# Patient Record
Sex: Female | Born: 1995 | Race: White | Hispanic: No | Marital: Single | Smoking: Never smoker
Health system: Southern US, Community
[De-identification: ages and names within clinical notes are randomized; demographics above are authoritative.]

## PROBLEM LIST (undated history)

## (undated) DIAGNOSIS — L309 Dermatitis, unspecified: Secondary | ICD-10-CM

## (undated) DIAGNOSIS — F909 Attention-deficit hyperactivity disorder, unspecified type: Secondary | ICD-10-CM

## (undated) DIAGNOSIS — F32A Depression, unspecified: Secondary | ICD-10-CM

## (undated) DIAGNOSIS — J45909 Unspecified asthma, uncomplicated: Secondary | ICD-10-CM

## (undated) DIAGNOSIS — N390 Urinary tract infection, site not specified: Secondary | ICD-10-CM

## (undated) DIAGNOSIS — F419 Anxiety disorder, unspecified: Secondary | ICD-10-CM

## (undated) DIAGNOSIS — F329 Major depressive disorder, single episode, unspecified: Secondary | ICD-10-CM

---
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
## 2015-01-15 IMAGING — CR DG ABDOMEN 2V
3 series · 3 of 3 positions shown · non-contrast
Comparison: None.

CLINICAL DATA: One week history of diarrhea ; bloody stools last
night.

EXAM:
ABDOMEN - 2 VIEW

[w abdomen upright]
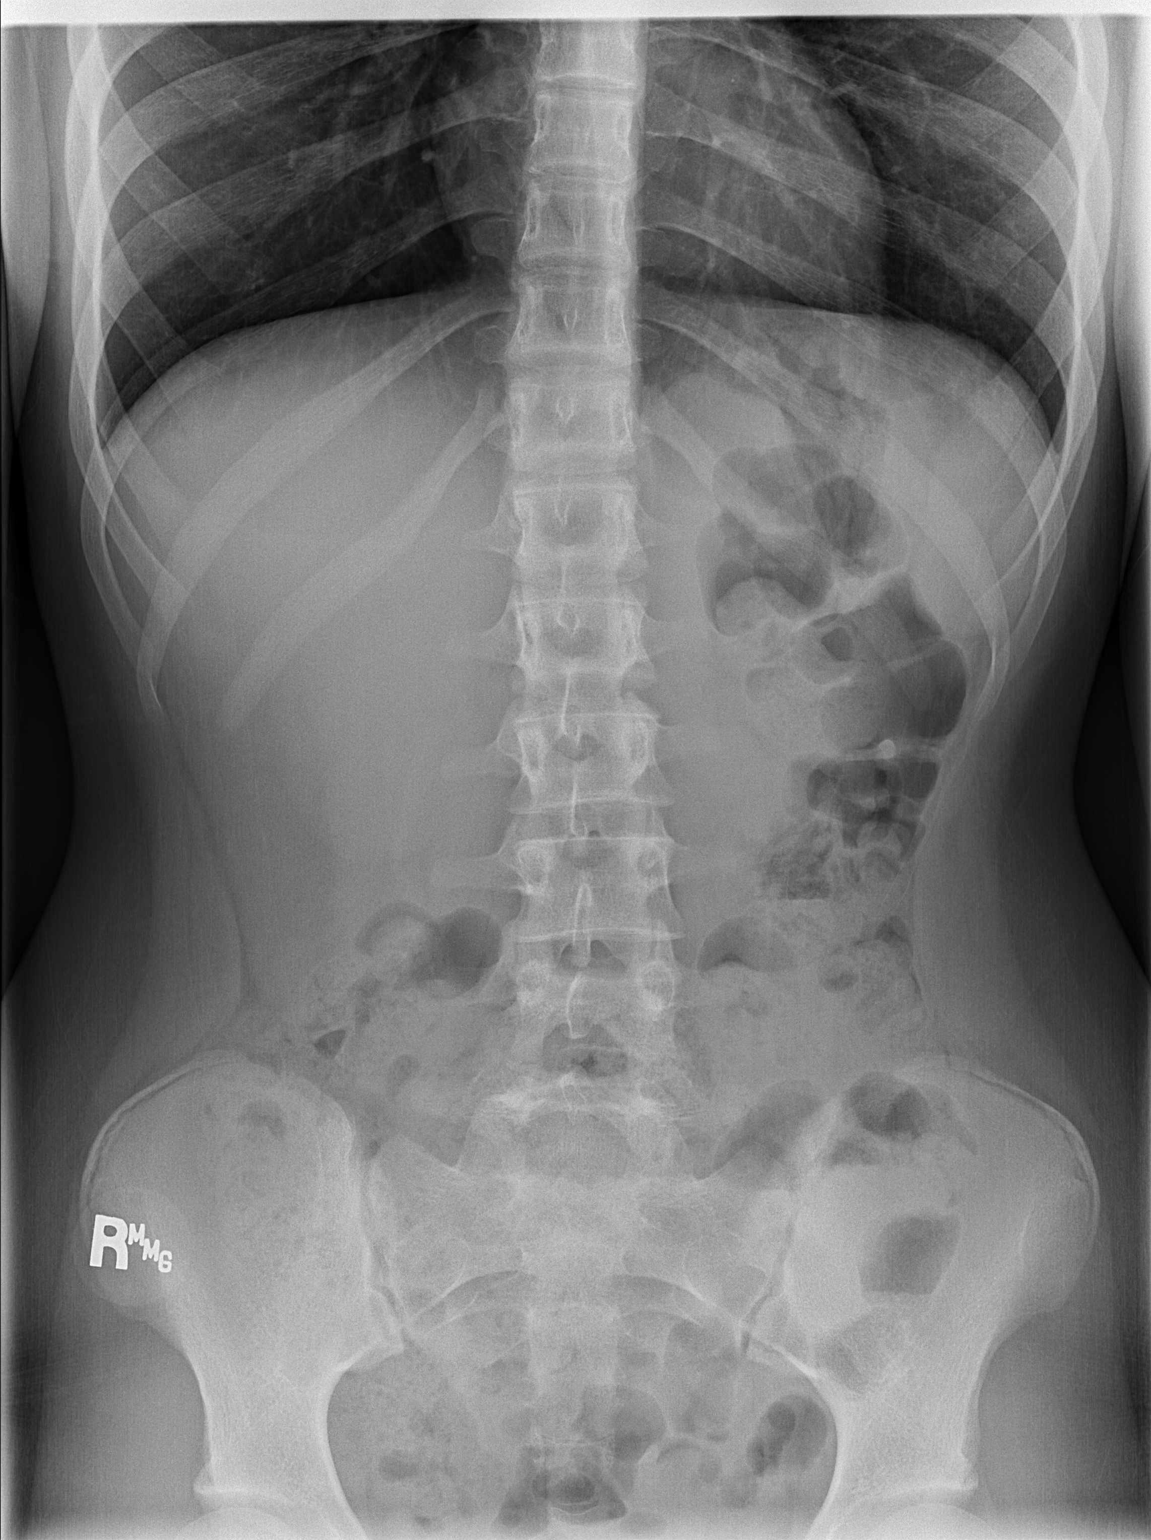

[t abdomen supine (1 of 2)]
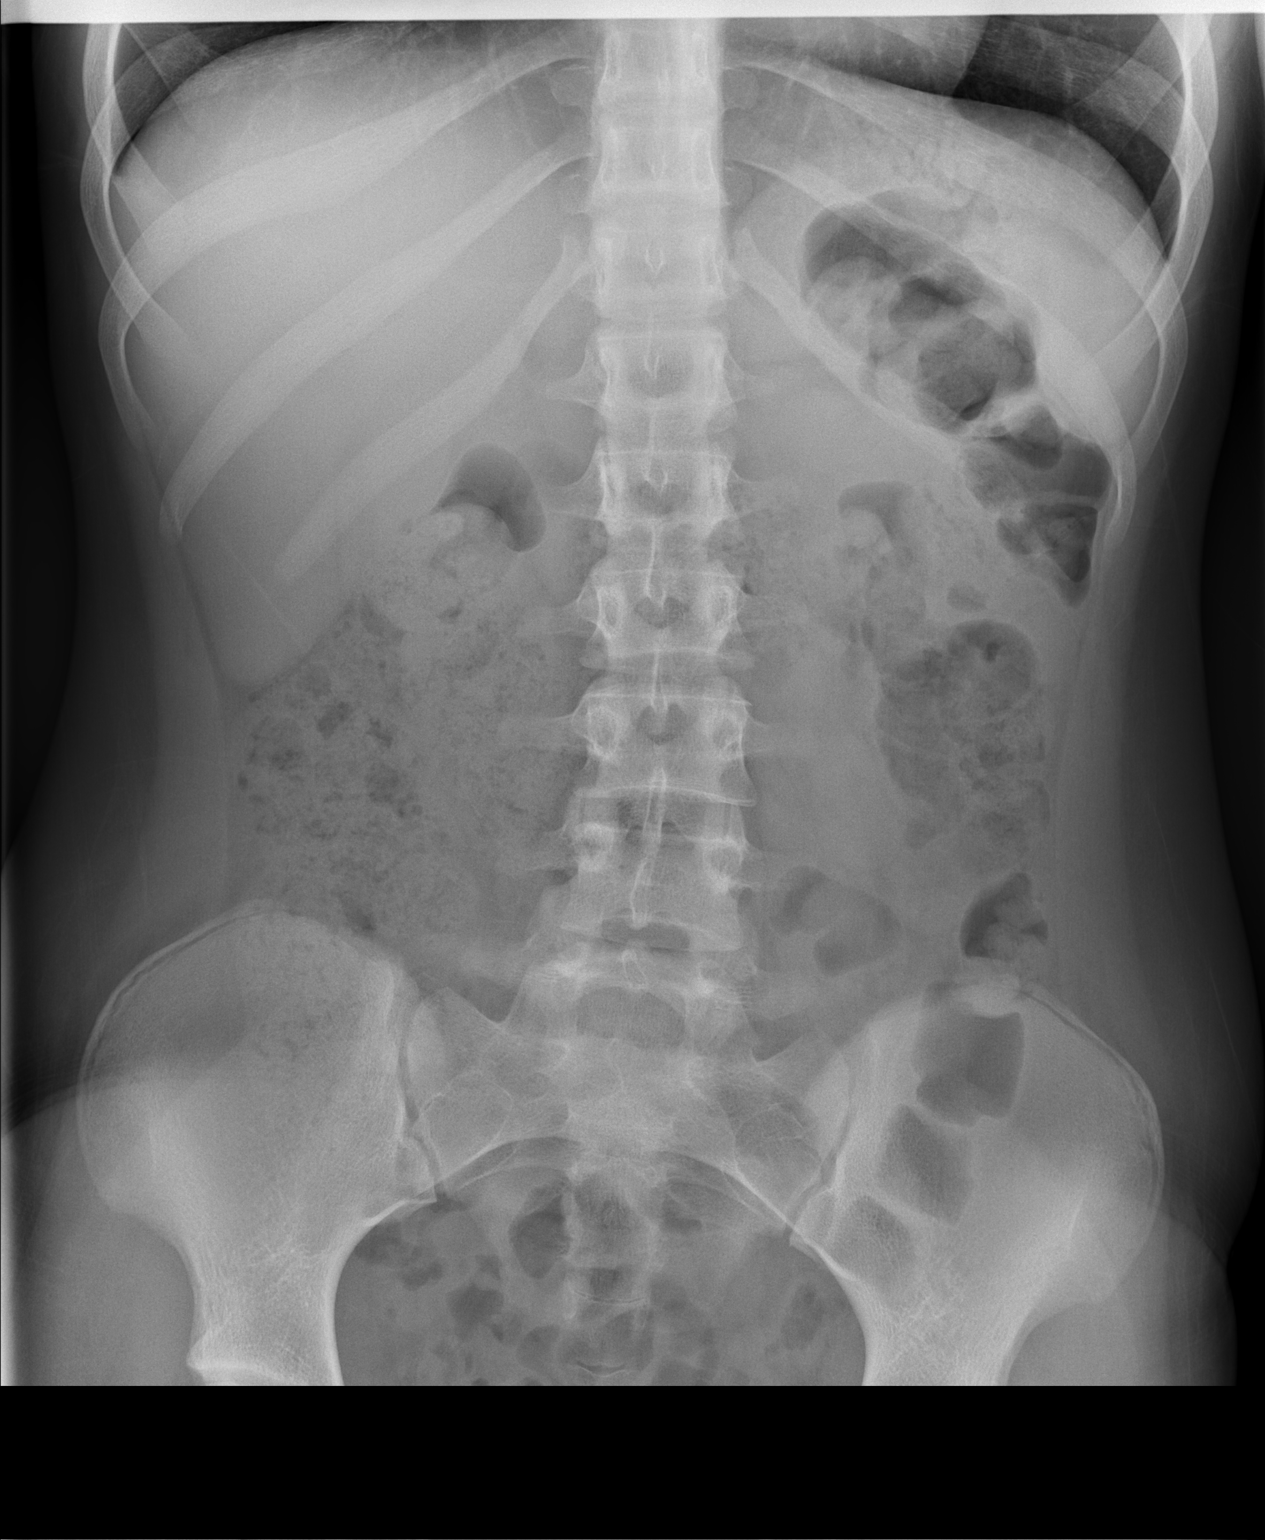

[t abdomen supine (2 of 2)]
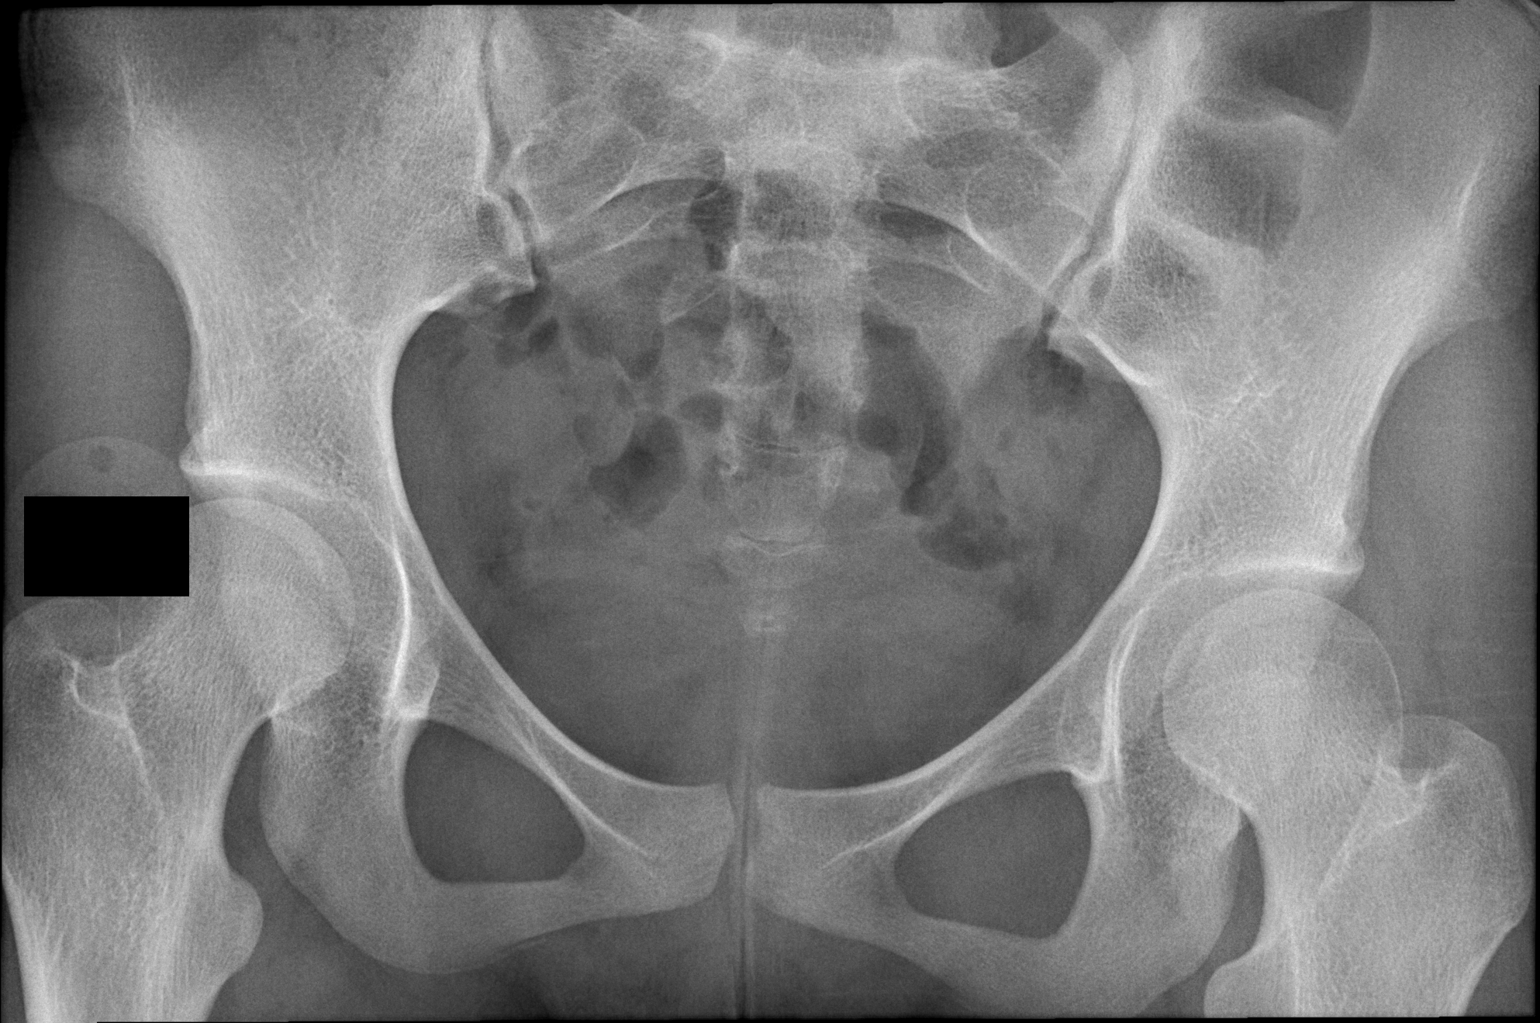

[3 of 3 positions shown; findings below may reference images not displayed]

FINDINGS: The bowel gas pattern is within the limits of normal. There is
mildly increased stool burden present especially in the right colon.
There is no evidence of a large or small bowel obstruction. No free
extraluminal gas collections are demonstrated. There are no abnormal
soft tissue calcifications. The observed bony structures appear
normal. The lung bases are clear.
IMPRESSION: There is a moderate stool burden within the colon. There is no
evidence of ileus nor obstruction.

## 2015-03-21 ENCOUNTER — Emergency Department (INDEPENDENT_AMBULATORY_CARE_PROVIDER_SITE_OTHER)
Admission: EM | Admit: 2015-03-21 | Discharge: 2015-03-21 | Disposition: A | Discharge status: Home / Self Care | Payer: PRIVATE HEALTH INSURANCE | Source: Home / Self Care | Attending: Emergency Medicine | Admitting: Emergency Medicine

## 2015-03-21 ENCOUNTER — Encounter (HOSPITAL_COMMUNITY): Payer: Self-pay | Admitting: Emergency Medicine

## 2015-03-21 DIAGNOSIS — N390 Urinary tract infection, site not specified: Secondary | ICD-10-CM | POA: Diagnosis not present

## 2015-03-21 LAB — POCT URINALYSIS DIP (DEVICE)
Bilirubin Urine: NEGATIVE
Glucose, UA: NEGATIVE mg/dL
Ketones, ur: 15 mg/dL — AB
Nitrite: POSITIVE — AB
Protein, ur: 30 mg/dL — AB
Specific Gravity, Urine: >=1.03 (ref 1.005–1.030)
UROBILINOGEN UA: 0.2 mg/dL (ref 0.0–1.0)
pH: 6 (ref 5.0–8.0)

## 2015-03-21 LAB — POCT PREGNANCY, URINE: PREG TEST UR: NEGATIVE

## 2015-03-21 MED ORDER — CEPHALEXIN 500 MG PO CAPS: 500.0000 mg | ORAL_CAPSULE | Freq: Four times a day (QID) | ORAL | Status: DC

## 2015-03-21 MED ORDER — NORETHIN-ETH ESTRAD TRIPHASIC 0.5/0.75/1-35 MG-MCG PO TABS: 1.0000 | ORAL_TABLET | Freq: Every day | ORAL | Status: AC

## 2015-03-21 NOTE — ED Provider Notes (Signed)
CSN: [id number]     Arrival date & time 03/21/15  1800 History   First MD Initiated Contact with Patient 03/21/15 1937     Chief Complaint  Patient presents with  . Recurrent UTI   (Consider location/radiation/quality/duration/timing/severity/associated sxs/prior Treatment) Patient is a 20 y.o. female presenting with frequency. The history is provided by the patient. No language interpreter was used.  Urinary Frequency This is a new problem. The problem occurs constantly. The problem has been gradually worsening. Associated symptoms include shortness of breath. Nothing aggravates the symptoms. Nothing relieves the symptoms. She has tried nothing for the symptoms. The treatment provided no relief.  Pt complains of burning with urination.   Past Medical History  Diagnosis Date  . Asthma   . Eczema   . UTI (urinary tract infection)   . ADHD (attention deficit hyperactivity disorder)   . Depressed   . Anxiety    History reviewed. No pertinent past surgical history. No family history on file. Social History  Substance Use Topics  . Smoking status: Never Smoker   . Smokeless tobacco: None  . Alcohol Use: No   OB History    No data available     Review of Systems  Respiratory: Positive for shortness of breath.   Genitourinary: Positive for frequency.  All other systems reviewed and are negative.   Allergies  Sulfur  Home Medications   Prior to Admission medications   Medication Sig Start Date End Date Taking? Authorizing Provider  albuterol (PROVENTIL HFA;VENTOLIN HFA) 108 (90 BASE) MCG/ACT inhaler Inhale 2 puffs into the lungs every 6 (six) hours as needed for wheezing or shortness of breath.    Historical Provider, MD  albuterol (PROVENTIL) (2.5 MG/3ML) 0.083% nebulizer solution Take 2.5 mg by nebulization every 6 (six) hours as needed for wheezing or shortness of breath.    Historical Provider, MD  diphenhydrAMINE (BENADRYL) 25 MG tablet Take 25 mg by mouth every 6 (six)  hours as needed for itching or allergies.    Historical Provider, MD  famotidine (PEPCID) 20 MG tablet Take 1 tablet (20 mg total) by mouth 2 (two) times daily. Patient not taking: Reported on 02/10/2014 06/11/13   Charlestine Night, PA-C  norelgestromin-ethinyl estradiol (ORTHO EVRA) 150-35 MCG/24HR transdermal patch Place 1 patch onto the skin once a week.    Historical Provider, MD  polyethylene glycol powder (GLYCOLAX/MIRALAX) powder Take 17 g by mouth daily. Patient not taking: Reported on 02/10/2014 07/20/13   Shelly Rubenstein, MD  predniSONE (DELTASONE) 50 MG tablet Take 1 tablet (50 mg total) by mouth daily. Patient not taking: Reported on 02/10/2014 06/11/13   Charlestine Night, PA-C   Meds Ordered and Administered this Visit  Medications - No data to display  BP 114/63 mmHg  Pulse 70  Temp(Src) 98.3 F (36.8 C) (Oral)  Resp 14  SpO2 100% No data found.   Physical Exam  Constitutional: She is oriented to person, place, and time. She appears well-developed and well-nourished.  HENT:  Head: Normocephalic.  Eyes: EOM are normal.  Neck: Normal range of motion.  Pulmonary/Chest: Effort normal.  Abdominal: She exhibits no distension.  Musculoskeletal: Normal range of motion.  Neurological: She is alert and oriented to person, place, and time.  Psychiatric: She has a normal mood and affect.  Nursing note and vitals reviewed.   ED Course  Procedures (including critical care time)  Labs Review Labs Reviewed  POCT URINALYSIS DIP (DEVICE) - Abnormal; Notable for the following:    Ketones, ur  15 (*)    Hgb urine dipstick TRACE (*)    Protein, ur 30 (*)    Nitrite POSITIVE (*)    Leukocytes, UA SMALL (*)    All other components within normal limits  POCT PREGNANCY, URINE    Imaging Review No results found.   Visual Acuity Review  Right Eye Distance:   Left Eye Distance:   Bilateral Distance:    Right Eye Near:   Left Eye Near:    Bilateral Near:           Pt given rx for keflex.   Pt is out of birth control.  Pt is trying to get a new provider. Pt given rx for ortotricyclic.    1. UTI (lower urinary tract infection)      Medication List    TAKE these medications        cephALEXin 500 MG capsule  Commonly known as:  KEFLEX  Take 1 capsule (500 mg total) by mouth 4 (four) times daily.     norethindrone-ethinyl estradiol 0.5/0.75/1-35 MG-MCG tablet  Commonly known as:  CYCLAFEM,ALYACEN  Take 1 tablet by mouth daily.      ASK your doctor about these medications        albuterol (2.5 MG/3ML) 0.083% nebulizer solution  Commonly known as:  PROVENTIL  Take 2.5 mg by nebulization every 6 (six) hours as needed for wheezing or shortness of breath.     albuterol 108 (90 Base) MCG/ACT inhaler  Commonly known as:  PROVENTIL HFA;VENTOLIN HFA  Inhale 2 puffs into the lungs every 6 (six) hours as needed for wheezing or shortness of breath.     diphenhydrAMINE 25 MG tablet  Commonly known as:  BENADRYL  Take 25 mg by mouth every 6 (six) hours as needed for itching or allergies.     famotidine 20 MG tablet  Commonly known as:  PEPCID  Take 1 tablet (20 mg total) by mouth 2 (two) times daily.     norelgestromin-ethinyl estradiol 150-35 MCG/24HR transdermal patch  Commonly known as:  ORTHO EVRA  Place 1 patch onto the skin once a week.     polyethylene glycol powder powder  Commonly known as:  GLYCOLAX/MIRALAX  Take 17 g by mouth daily.     predniSONE 50 MG tablet  Commonly known as:  DELTASONE  Take 1 tablet (50 mg total) by mouth daily.          Lonia SkinnerLeslie K SharonSofia, PA-C 03/21/15 2041

## 2015-03-21 NOTE — ED Notes (Signed)
History of uti.  3 day history of burning/stinging with urination.  Noted frequency.  Patient has been using AZO for several days

## 2015-03-21 NOTE — Discharge Instructions (Signed)

## 2015-05-03 ENCOUNTER — Emergency Department (INDEPENDENT_AMBULATORY_CARE_PROVIDER_SITE_OTHER)
Admission: EM | Admit: 2015-05-03 | Discharge: 2015-05-03 | Disposition: A | Discharge status: Home / Self Care | Payer: PRIVATE HEALTH INSURANCE | Source: Home / Self Care | Attending: Emergency Medicine | Admitting: Emergency Medicine

## 2015-05-03 ENCOUNTER — Other Ambulatory Visit (HOSPITAL_COMMUNITY)
Admission: RE | Admit: 2015-05-03 | Discharge: 2015-05-03 | Disposition: A | Discharge status: Home / Self Care | Payer: PRIVATE HEALTH INSURANCE | Source: Ambulatory Visit | Attending: Emergency Medicine | Admitting: Emergency Medicine

## 2015-05-03 ENCOUNTER — Encounter (HOSPITAL_COMMUNITY): Payer: Self-pay | Admitting: Emergency Medicine

## 2015-05-03 DIAGNOSIS — N390 Urinary tract infection, site not specified: Secondary | ICD-10-CM | POA: Diagnosis not present

## 2015-05-03 LAB — POCT URINALYSIS DIP (DEVICE)
Bilirubin Urine: NEGATIVE
GLUCOSE, UA: NEGATIVE mg/dL
KETONES UR: NEGATIVE mg/dL
Nitrite: NEGATIVE
PH: 7.5 (ref 5.0–8.0)
PROTEIN: 30 mg/dL — AB
SPECIFIC GRAVITY, URINE: 1.02 (ref 1.005–1.030)
UROBILINOGEN UA: 0.2 mg/dL (ref 0.0–1.0)

## 2015-05-03 LAB — POCT PREGNANCY, URINE: Preg Test, Ur: NEGATIVE

## 2015-05-03 MED ORDER — CEPHALEXIN 500 MG PO CAPS: 500.0000 mg | ORAL_CAPSULE | Freq: Two times a day (BID) | ORAL | Status: AC

## 2015-05-03 MED ORDER — PHENAZOPYRIDINE HCL 200 MG PO TABS: 200.0000 mg | ORAL_TABLET | Freq: Three times a day (TID) | ORAL | Status: AC | PRN

## 2015-05-03 NOTE — ED Notes (Signed)
Pt states she has had several UTI's in the past, and they all started with urinary frequency.  She states she has had frequency for the last week, but denies any pain, burning, or itching and has not had a fever.

## 2015-05-03 NOTE — Discharge Instructions (Signed)
Go to www.goodrx.com to look up your medications. This will give you a list of where you can find your prescriptions at the most affordable prices.  ° °This practice is taking new patients. They will see you even if you do not have insurance. ° °[facility] °[address] °[place], [place]  °[address] °[phone] ° °If you have no primary doctor, here are some resources that may be helpful: ° °- [facility]: °[address], [place], [place] [address] [place], [place] [address] °[phone] ° °Medicaid-accepting [place] Providers: °- [facility]- [address]  °([phone];  ° °- [facility]- [address], [address] °[phone] ° °- [facility]- [address], [address] °[phone] ° °- [facility]- [address] [place] [address] °[phone] ° °- [facility]- [address] °[phone]. Only accepts [place] Access Medicaid patients after they have her name applied to their card ° °-Dr. George Osei-Bonsu, [facility]. [address] [place] [address]    [place], [place] [address]  °[phone] ° °Self Pay (no insurance) in [place]: °- Sickle Cell Patients: Dr Eric Dean, [facility] [address] [phone] ° °- [facility]- [phone] ° °- Physician Referral Service- [phone] ° °- [facility]- [address], [place], [phone];  °Monday to Friday, 9 a.m. - 7 p.m.; Saturday 9 a.m. to 1 p.m. ° °- [facility] [place]- [address] [place], [place] [phone] ° °- [facility]- [address] [place] [address] [phone] °- [facility]- [address] [phone] °- [facility], [address] [place]; [phone]; or [address] [place] [address], [place]; [phone].  ° °[facility] [place], [address] [place]; [phone]; Monday to  Wednesday, 8:30 a.m. - 5 p.m.; Thursday, 8:30 a.m. - 8 p.m. ° °[facility], [address], [place]; [phone]; Monday to Friday, 8 a.m. - 4:30 p.m.  ° °[facility], [address] [place], [phone]; first and third Saturday of the month, 9:30 a.m. - 12:30 p.m. ° °[facility], [address] [place], [phone]; second Saturday of the month, 9 a.m. -noon. ° °[facility] for children. For information, call [phone]; [phone]; or [phone]. ° °Other agencies that provide inexpensive medical care: ° °   [facility]  [phone] °   [facility]  [phone] °   [facility]  [phone]  [place] [address] °   [facility]  [phone] °   [facility]  [phone], [phone]; or [phone]. ° °Chronic Pain Problems °Contact [facility]  [phone] °Patients need to be referred by their primary care doctor. ° °[place] Resources ° °[facility] [place]     [employer]                          [facility] [facility] °[address] [place]                       [address]      [address]  ° °[phone] (After Hours) ° °General Information: °Finding a doctor when you do not have health insurance   can be tricky. Although you are not limited by an insurance plan, you are of course limited by her finances and how much but he can pay out of pocket. ° °What are your options if you don't have health insurance? °  °1) Find a Doctor and Pay Out of Pocket °Although you won't have to find out who is covered by your insurance plan, it is a good idea to ask around and get recommendations. You will then need to call the office and see if the doctor you have chosen will accept you as a new patient and what types of options they offer for patients who are self-pay. Some doctors offer discounts or will set up payment  plans for their patients who do not have insurance, but you will need to ask so you aren't surprised when you get to your appointment. ° °2) Contact Your Local Health Department °Not all health departments have doctors that can see patients for sick visits, but many do, so it is worth a call to see if yours does. If you don't know where your local health department is, you can check in your phone book. The CDC also has a tool to help you locate your state's health department, and many state websites also have listings of all of their local health departments. ° °3) Find a Walk-in Clinic °If your illness is not likely to be very severe or complicated, you may want to try a walk in clinic. These are popping up all over the country in pharmacies, drugstores, and shopping centers. They're usually staffed by nurse practitioners or physician assistants that have been trained to treat common illnesses and complaints. They're usually fairly quick and inexpensive. However, if you have serious medical issues or chronic medical problems, these are probably not your best option ° °

## 2015-05-03 NOTE — ED Provider Notes (Signed)
HPI  SUBJECTIVE:  Jennifer Jefferson is a 20 y.o. female who presents with pt with frequency, cloudy, oderous urine starting 1 week ago. No aggravating or alleviating factors.  Tried azo for several dayswithout improvement.  No urgency, dysuria, hematuria. No nausea, vomiting, fever, chills, back pain. .  No  other abdominal pain, abdominal distention.  No vaginal bleeding or discharge/odor, vulvar itching, genital rash.  Patient is in a monoagmous sexual relationship with a female partner who is asxatic.   STDs are not a concern today.   Pt is not pregnant. Past medical history of UTIs, treated with Keflex patient states worked for her.  No recent antibiotic, history of recurrent UTIs, diabetes, nephrolithiasis. . No h/o STD's, BV, yeast infections.  States this feels similar to previous UTIs.    Past Medical History  Diagnosis Date  . Asthma   . Eczema   . UTI (urinary tract infection)   . ADHD (attention deficit hyperactivity disorder)   . Depressed   . Anxiety     History reviewed. No pertinent past surgical history.  History reviewed. No pertinent family history.  Social History  Substance Use Topics  . Smoking status: Never Smoker   . Smokeless tobacco: None  . Alcohol Use: No    No current facility-administered medications for this encounter.  Current outpatient prescriptions:  .  albuterol (PROVENTIL HFA;VENTOLIN HFA) 108 (90 BASE) MCG/ACT inhaler, Inhale 2 puffs into the lungs every 6 (six) hours as needed for wheezing or shortness of breath., Disp: , Rfl:  .  albuterol (PROVENTIL) (2.5 MG/3ML) 0.083% nebulizer solution, Take 2.5 mg by nebulization every 6 (six) hours as needed for wheezing or shortness of breath., Disp: , Rfl:  .  cephALEXin (KEFLEX) 500 MG capsule, Take 1 capsule (500 mg total) by mouth 2 (two) times daily., Disp: 14 capsule, Rfl: 0 .  diphenhydrAMINE (BENADRYL) 25 MG tablet, Take 25 mg by mouth every 6 (six) hours as needed for itching or allergies.,  Disp: , Rfl:  .  norethindrone-ethinyl estradiol (CYCLAFEM,ALYACEN) 0.5/0.75/1-35 MG-MCG tablet, Take 1 tablet by mouth daily., Disp: 1 Package, Rfl: 3 .  phenazopyridine (PYRIDIUM) 200 MG tablet, Take 1 tablet (200 mg total) by mouth 3 (three) times daily as needed for pain., Disp: 6 tablet, Rfl: 0 .  [DISCONTINUED] famotidine (PEPCID) 20 MG tablet, Take 1 tablet (20 mg total) by mouth 2 (two) times daily. (Patient not taking: Reported on 02/10/2014), Disp: 10 tablet, Rfl: 0 .  [DISCONTINUED] norelgestromin-ethinyl estradiol (ORTHO EVRA) 150-35 MCG/24HR transdermal patch, Place 1 patch onto the skin once a week., Disp: , Rfl:   Allergies  Allergen Reactions  . Sulfur Hives     ROS  As noted in HPI.   Physical Exam  BP 111/64 mmHg  Pulse 60  Temp(Src) 98.4 F (36.9 C) (Oral)  Resp 16  SpO2 99%  LMP 04/26/2015 (Within Days)  Constitutional: Well developed, well nourished, no acute distress Eyes:  EOMI, conjunctiva normal bilaterally HENT: Normocephalic, atraumatic,mucus membranes moist Respiratory: Normal inspiratory effort Cardiovascular: Normal rate GI: nondistended soft, nontender, normal bowel sounds. Back: No CVAT skin: No rash, skin intact Musculoskeletal: no deformities Neurologic: Alert & oriented x 3, no focal neuro deficits Psychiatric: Speech and behavior appropriate   ED Course   Medications - No data to display  Orders Placed This Encounter  Procedures  . Urine culture    Standing Status: Standing     Number of Occurrences: 1     Standing Expiration Date:  Order Specific Question:  Patient immune status    Answer:  Normal  . Pregnancy, urine POC    Standing Status: Standing     Number of Occurrences: 1     Standing Expiration Date:   . POCT urinalysis dip (device)    Standing Status: Standing     Number of Occurrences: 1     Standing Expiration Date:     Results for orders placed or performed during the hospital encounter of 05/03/15 (from  the past 24 hour(s))  POCT urinalysis dip (device)     Status: Abnormal   Collection Time: 05/03/15  1:44 PM  Result Value Ref Range   Glucose, UA NEGATIVE NEGATIVE mg/dL   Bilirubin Urine NEGATIVE NEGATIVE   Ketones, ur NEGATIVE NEGATIVE mg/dL   Specific Gravity, Urine 1.020 1.005 - 1.030   Hgb urine dipstick TRACE (A) NEGATIVE   pH 7.5 5.0 - 8.0   Protein, ur 30 (A) NEGATIVE mg/dL   Urobilinogen, UA 0.2 0.0 - 1.0 mg/dL   Nitrite NEGATIVE NEGATIVE   Leukocytes, UA SMALL (A) NEGATIVE  Pregnancy, urine POC     Status: None   Collection Time: 05/03/15  1:46 PM  Result Value Ref Range   Preg Test, Ur NEGATIVE NEGATIVE   No results found.  ED Clinical Impression  UTI (lower urinary tract infection)  ED Assessment/Plan  Patient has no GU complaints, deferring pelvic today. Home with Keflex, since this worked for her in the past. We'll send urine off for culture To confirm antibiotic choice.  UA consistent with UTI. She is not pregnant. Plan as above. Providing primary care referral list. Return here if not getting better, to the ER if gets worse.  Discussed labs,  MDM, plan and followup with patient. Patient agrees with plan.   *This clinic note was created using Dragon dictation software. Therefore, there may be occasional mistakes despite careful proofreading.  ?    Domenick Gong, MD 05/03/15 1352

## 2015-05-05 LAB — URINE CULTURE
Culture: >=100000
SPECIAL REQUESTS: NORMAL

## 2015-05-08 ENCOUNTER — Telehealth (HOSPITAL_COMMUNITY): Payer: Self-pay | Admitting: Emergency Medicine

## 2015-05-08 NOTE — ED Notes (Signed)
x1 Attempt  LM on pt's VM [phone] Need to give lab results from recent visit on 2/22  Per Dr. Dayton Scrape,  Urine culture growing E coli sensitive to keflex. Rx keflex given at [facility] visit 05/03/15. Finish keflex. Recheck or followup PCP for persistent symptoms. LM  Will try later.

## 2015-05-11 NOTE — ED Notes (Signed)
x2 Attempt  LM on pt's VM [phone]... Some one picked up and said it was the wrong # Need to give lab results from recent visit on 2/22  Per Dr. Dayton Scrape,  Urine culture growing E coli sensitive to keflex. Rx keflex given at [facility] visit 05/03/15. Finish keflex. Recheck or followup PCP for persistent symptoms. LM  Mailed letter as x3 attempt

## 2015-10-03 ENCOUNTER — Encounter (HOSPITAL_COMMUNITY): Payer: Self-pay | Admitting: Emergency Medicine

## 2015-10-03 ENCOUNTER — Ambulatory Visit (HOSPITAL_COMMUNITY)
Admission: EM | Admit: 2015-10-03 | Discharge: 2015-10-03 | Disposition: A | Discharge status: Home / Self Care | Payer: Medicaid Other | Attending: Radiology | Admitting: Radiology

## 2015-10-03 DIAGNOSIS — J069 Acute upper respiratory infection, unspecified: Secondary | ICD-10-CM

## 2015-10-03 DIAGNOSIS — R05 Cough: Secondary | ICD-10-CM

## 2015-10-03 DIAGNOSIS — R059 Cough, unspecified: Secondary | ICD-10-CM

## 2015-10-03 DIAGNOSIS — B9789 Other viral agents as the cause of diseases classified elsewhere: Secondary | ICD-10-CM

## 2015-10-03 MED ORDER — METHYLPREDNISOLONE 4 MG PO TBPK: ORAL_TABLET | ORAL | 0 refills | Status: AC

## 2015-10-03 MED ORDER — TRIAMCINOLONE ACETONIDE 40 MG/ML IJ SUSP
40.0000 mg | Freq: Once | INTRAMUSCULAR | Status: AC
  Administered 2015-10-03: 40 mg via INTRAMUSCULAR

## 2015-10-03 MED ORDER — TRIAMCINOLONE ACETONIDE 40 MG/ML IJ SUSP
INTRAMUSCULAR | Status: AC
  Filled 2015-10-03: qty 1

## 2015-10-03 NOTE — ED Provider Notes (Signed)
CSN: [id number]     Arrival date & time 10/03/15  1731 History   None    Chief Complaint  Patient presents with  . Cough   (Consider location/radiation/quality/duration/timing/severity/associated sxs/prior Treatment) Patient presents with a sore throat and cough Condition is acute in nature. Condition is made better by nothing. Condition is made worse by laying down. Patient denies any relief from asthma medications and hall throat losangers prior to arrival at this facility.        Past Medical History:  Diagnosis Date  . ADHD (attention deficit hyperactivity disorder)   . Anxiety   . Asthma   . Depressed   . Eczema   . UTI (urinary tract infection)    History reviewed. No pertinent surgical history. No family history on file. Social History  Substance Use Topics  . Smoking status: Never Smoker  . Smokeless tobacco: Never Used  . Alcohol use No   OB History    No data available     Review of Systems  Constitutional: Negative.  Negative for fever.  HENT: Positive for sore throat. Negative for congestion, ear pain, sinus pressure and sneezing.   Respiratory: Positive for cough (non productive).     Allergies  Sulfur  Home Medications   Prior to Admission medications   Medication Sig Start Date End Date Taking? Authorizing Provider  albuterol (PROVENTIL HFA;VENTOLIN HFA) 108 (90 BASE) MCG/ACT inhaler Inhale 2 puffs into the lungs every 6 (six) hours as needed for wheezing or shortness of breath.    Historical Provider, MD  albuterol (PROVENTIL) (2.5 MG/3ML) 0.083% nebulizer solution Take 2.5 mg by nebulization every 6 (six) hours as needed for wheezing or shortness of breath.    Historical Provider, MD  cephALEXin (KEFLEX) 500 MG capsule Take 1 capsule (500 mg total) by mouth 2 (two) times daily. 05/03/15   Domenick Gong, MD  diphenhydrAMINE (BENADRYL) 25 MG tablet Take 25 mg by mouth every 6 (six) hours as needed for itching or allergies.    Historical Provider,  MD  methylPREDNISolone (MEDROL DOSEPAK) 4 MG TBPK tablet Marcellina Comins 10/03/15   Alene Mires, NP  norethindrone-ethinyl estradiol (CYCLAFEM,ALYACEN) 0.5/0.75/1-35 MG-MCG tablet Take 1 tablet by mouth daily. 03/21/15   Elson Areas, PA-C  phenazopyridine (PYRIDIUM) 200 MG tablet Take 1 tablet (200 mg total) by mouth 3 (three) times daily as needed for pain. 05/03/15   Domenick Gong, MD   Meds Ordered and Administered this Visit   Medications  triamcinolone acetonide (KENALOG-40) injection 40 mg (40 mg Intramuscular Given 10/03/15 1950)    BP 120/83 (BP Location: Left Arm)   Pulse 70   Temp 98 F (36.7 C) (Oral)   Resp 18   Ht  (1.575 m)   Wt 124 lb (56.2 kg)   LMP 09/12/2015   SpO2 97%   BMI 22.68 kg/m  No data found.   Physical Exam  Constitutional: She is oriented to person, place, and time. She appears well-developed and well-nourished.  HENT:  + nasal congestion  Pulmonary/Chest: Effort normal and breath sounds normal. No respiratory distress. She has no wheezes. She has no rales. She exhibits no tenderness.  Neurological: She is alert and oriented to person, place, and time.  Skin: Skin is warm and dry.    Urgent Care Course   Clinical Course    Procedures (including critical care time)  Labs Review Labs Reviewed - No data to display  Imaging Review No results found.   Visual Acuity Review  Right Eye Distance:   Left Eye Distance:   Bilateral Distance:    Right Eye Near:   Left Eye Near:    Bilateral Near:         MDM   1. Cough   2. Viral URI with cough        Alene Mires, NP 10/03/15 2016

## 2015-10-03 NOTE — ED Triage Notes (Signed)
Pt. Stated, I've had a cough for a week and they said it was my allergies but my allergy med is not working.

## 2015-10-03 NOTE — Discharge Instructions (Signed)
Continue with allergy medicine as directed

## 2017-02-21 ENCOUNTER — Encounter (HOSPITAL_COMMUNITY): Payer: Self-pay | Admitting: Emergency Medicine

## 2017-02-21 ENCOUNTER — Ambulatory Visit (HOSPITAL_COMMUNITY)
Admission: EM | Admit: 2017-02-21 | Discharge: 2017-02-21 | Disposition: A | Discharge status: Home / Self Care | Payer: Self-pay | Attending: Internal Medicine | Admitting: Internal Medicine

## 2017-02-21 DIAGNOSIS — R350 Frequency of micturition: Secondary | ICD-10-CM | POA: Insufficient documentation

## 2017-02-21 DIAGNOSIS — Z79899 Other long term (current) drug therapy: Secondary | ICD-10-CM | POA: Insufficient documentation

## 2017-02-21 LAB — POCT URINALYSIS DIP (DEVICE)
Bilirubin Urine: NEGATIVE
Glucose, UA: NEGATIVE mg/dL
Hgb urine dipstick: NEGATIVE
Ketones, ur: NEGATIVE mg/dL
Leukocytes, UA: NEGATIVE
NITRITE: NEGATIVE
PH: 5.5 (ref 5.0–8.0)
PROTEIN: NEGATIVE mg/dL
Specific Gravity, Urine: >=1.03 (ref 1.005–1.030)
Urobilinogen, UA: 0.2 mg/dL (ref 0.0–1.0)

## 2017-02-21 LAB — POCT PREGNANCY, URINE: PREG TEST UR: NEGATIVE

## 2017-02-21 NOTE — ED Triage Notes (Signed)
PT C/O: UTI sx  ONSET: 1 week  SX ALSO INCLUDE: left flank pain, urinary freq, abd pain  DENIES: fevers, chills, vag d/c  TAKING MEDS: none  A&O x4... NAD... Ambulatory

## 2017-02-21 NOTE — ED Provider Notes (Signed)
[facility]    CSN: [id number] Arrival date & time: 02/21/17  1024     History   Chief Complaint Chief Complaint  Patient presents with  . Urinary Tract Infection    HPI Jennifer Jefferson is a 21 y.o. female.   21 year old female planning of urinary frequency and a cloudy urine for nearly a week. Is also complaining of left anterolateral chest wall abdominal pain. As is often exacerbated by certain movements and twisting.   Review of systems There is no pain in the back or posterior flank. Denies dysuria. She has a history of UTIs. Denies fever, chills, abdominal pain or tenderness, chest pain or shortness of breath.      Past Medical History:  Diagnosis Date  . ADHD (attention deficit hyperactivity disorder)   . Anxiety   . Asthma   . Depressed   . Eczema   . UTI (urinary tract infection)     There are no active problems to display for this patient.   History reviewed. No pertinent surgical history.  OB History    No data available       Home Medications    Prior to Admission medications   Medication Sig Start Date End Date Taking? Authorizing Provider  albuterol (PROVENTIL HFA;VENTOLIN HFA) 108 (90 BASE) MCG/ACT inhaler Inhale 2 puffs into the lungs every 6 (six) hours as needed for wheezing or shortness of breath.   Yes [provider]  albuterol (PROVENTIL) (2.5 MG/3ML) 0.083% nebulizer solution Take 2.5 mg by nebulization every 6 (six) hours as needed for wheezing or shortness of breath.   Yes [provider]  norethindrone-ethinyl estradiol (CYCLAFEM,ALYACEN) 0.5/0.75/1-35 MG-MCG tablet Take 1 tablet by mouth daily. 03/21/15  Yes Cheron SchaumannSofia, Leslie K, PA-C  cephALEXin (KEFLEX) 500 MG capsule Take 1 capsule (500 mg total) by mouth 2 (two) times daily. 05/03/15   Domenick GongMortenson, Ashley, MD  diphenhydrAMINE (BENADRYL) 25 MG tablet Take 25 mg by mouth every 6 (six) hours as needed for itching or allergies.    [provider]    methylPREDNISolone (MEDROL DOSEPAK) 4 MG TBPK tablet Jennifer Jefferson 10/03/15   Alene Miresmohundro, Jennifer C, NP  phenazopyridine (PYRIDIUM) 200 MG tablet Take 1 tablet (200 mg total) by mouth 3 (three) times daily as needed for pain. 05/03/15   Domenick GongMortenson, Ashley, MD    Family History Family History  Problem Relation Age of Onset  . Hypertension Mother     Social History Social History   Tobacco Use  . Smoking status: Never Smoker  . Smokeless tobacco: Never Used  Substance Use Topics  . Alcohol use: No  . Drug use: Yes    Types: Marijuana    Comment: occasional use     Allergies   Sulfur   Review of Systems Review of Systems  Constitutional: Negative.   Gastrointestinal: Negative.   Genitourinary: Negative for hematuria and vaginal discharge.       As per history of present illness  Neurological: Negative.   All other systems reviewed and are negative.    Physical Exam Triage Vital Signs ED Triage Vitals  Enc Vitals Group     BP 02/21/17 1100 119/72     Pulse Rate 02/21/17 1100 64     Resp 02/21/17 1100 20     Temp 02/21/17 1100 98.6 F (37 C)     Temp Source 02/21/17 1100 Oral     SpO2 02/21/17 1100 100 %     Weight --  Height --      Head Circumference --      Peak Flow --      Pain Score 02/21/17 1101 2     Pain Loc --      Pain Edu? --      Excl. in GC? --    No data found.  Updated Vital Signs BP 119/72 (BP Location: Left Arm)   Pulse 64   Temp 98.6 F (37 C) (Oral)   Resp 20   LMP 02/14/2017   SpO2 100%   Visual Acuity Right Eye Distance:   Left Eye Distance:   Bilateral Distance:    Right Eye Near:   Left Eye Near:    Bilateral Near:     Physical Exam  Constitutional: She is oriented to person, place, and time. She appears well-developed and well-nourished. No distress.  Eyes: EOM are normal.  Neck: Normal range of motion. Neck supple.  Cardiovascular: Normal rate.  Pulmonary/Chest: Effort normal. No respiratory distress.   Abdominal: Soft. She exhibits no distension. There is no tenderness.  Musculoskeletal: Normal range of motion. She exhibits no edema.  Tenderness to the left lower costal margin lateral aspect along the mid and anterior axillary line. Minor tenderness to the musculature from the lower costal margin ribs to the iliac crests. No anterior abdominal tenderness.  Neurological: She is alert and oriented to person, place, and time. She exhibits normal muscle tone.  Skin: Skin is warm and dry.  Psychiatric: She has a normal mood and affect.  Nursing note and vitals reviewed.    UC Treatments / Results  Labs (all labs ordered are listed, but only abnormal results are displayed) Labs Reviewed  URINE CULTURE  POCT URINALYSIS DIP (DEVICE)  POCT PREGNANCY, URINE    EKG  EKG Interpretation None       Radiology No results found.  Procedures Procedures (including critical care time)  Medications Ordered in UC Medications - No data to display   Initial Impression / Assessment and Plan / UC Course  I have reviewed the triage vital signs and the nursing notes.  Pertinent labs & imaging results that were available during my care of the patient were reviewed by me and considered in my medical decision making (see chart for details).    Drink plenty of water. Your urine was a little concentrated otherwise it was clear.    Final Clinical Impressions(s) / UC Diagnoses   Final diagnoses:  Urinary frequency    ED Discharge Orders    None       Controlled Substance Prescriptions  Controlled Substance Registry consulted? Not Applicable   Hayden RasmussenMabe, Oluwaseun Bruyere, NP 02/21/17 1153

## 2017-02-21 NOTE — Discharge Instructions (Signed)
Drink plenty of water. Your urine was a little concentrated otherwise it was clear.

## 2017-02-22 LAB — URINE CULTURE: Culture: NO GROWTH

## 2017-04-28 ENCOUNTER — Encounter (HOSPITAL_COMMUNITY): Payer: Self-pay | Admitting: Emergency Medicine

## 2017-04-28 ENCOUNTER — Ambulatory Visit (HOSPITAL_COMMUNITY)
Admission: EM | Admit: 2017-04-28 | Discharge: 2017-04-28 | Disposition: A | Discharge status: Home / Self Care | Payer: Self-pay | Attending: Urgent Care | Admitting: Urgent Care

## 2017-04-28 DIAGNOSIS — R05 Cough: Secondary | ICD-10-CM

## 2017-04-28 DIAGNOSIS — J209 Acute bronchitis, unspecified: Secondary | ICD-10-CM

## 2017-04-28 DIAGNOSIS — R042 Hemoptysis: Secondary | ICD-10-CM

## 2017-04-28 DIAGNOSIS — R0982 Postnasal drip: Secondary | ICD-10-CM

## 2017-04-28 DIAGNOSIS — R059 Cough, unspecified: Secondary | ICD-10-CM

## 2017-04-28 MED ORDER — PSEUDOEPHEDRINE HCL ER 120 MG PO TB12: 120.0000 mg | ORAL_TABLET | Freq: Two times a day (BID) | ORAL | 3 refills | Status: AC

## 2017-04-28 MED ORDER — AZITHROMYCIN 250 MG PO TABS: ORAL_TABLET | ORAL | 0 refills | Status: AC

## 2017-04-28 NOTE — ED Provider Notes (Signed)
  MRN: [id number] DOB: 01/20/1996  Subjective:   Jennifer Jefferson is a 22 y.o. female presenting for 1.5 week history of intermittent subjective fever, started having sneezing, hemoptysis, productive cough, post-nasal drainage, sinus drainage, nausea with vomiting ~2 weeks ago which has resolved. Has tried aspirin. Has a history of asthma. Uses QVAR. Denies sinus pain, ear pain, chest pain, shob, wheezing, abdominal pain. Denies smoking cigarettes. Smokes marijuana every other day.   Jennifer Jefferson is allergic to sulfur.  Jennifer Jefferson  has a past medical history of ADHD (attention deficit hyperactivity disorder), Anxiety, Asthma, Depressed, Eczema, and UTI (urinary tract infection). Denies past surgical history.  Objective:   Vitals: BP (!) 143/86 (BP Location: Right Arm)   Pulse (!) 101   Temp 98.3 F (36.8 C) (Oral)   Resp 16   Wt 127 lb (57.6 kg)   SpO2 100%   BMI 23.23 kg/m   Physical Exam  Constitutional: She is oriented to person, place, and time. She appears well-developed and well-nourished.  HENT:  Mouth/Throat: Oropharynx is clear and moist.  TM's normal.   Eyes: Right eye exhibits no discharge. Left eye exhibits no discharge.  Neck: Normal range of motion. Neck supple.  Cardiovascular: Normal rate, regular rhythm and intact distal pulses. Exam reveals no gallop and no friction rub.  No murmur heard. Pulmonary/Chest: No respiratory distress. She has no wheezes. She has no rales.  Lymphadenopathy:    She has no cervical adenopathy.  Neurological: She is alert and oriented to person, place, and time.  Skin: Skin is warm and dry.  Psychiatric: She has a normal mood and affect.   Assessment and Plan :   Acute bronchitis, unspecified organism  Cough  Hemoptysis  Post-nasal drainage  Start azithromycin, schedule albuterol inhaler. Use antihistamine with Sudafed. Hold of on smoking marijuana. Return-to-clinic precautions discussed, patient verbalized understanding.    Wallis BambergMani,  Jennifer Jefferson, [place]PA-C 04/28/17 1851

## 2017-04-28 NOTE — ED Triage Notes (Signed)
1.5 weeks ago PT had a fever. PT then developed cold symptoms 1 week. PT reports brown cough with tinges of blood a few days ago

## 2017-04-28 NOTE — Discharge Instructions (Signed)
Take Zyrtec, Allegra, Claritin with Sudafed daily.
